# Patient Record
Sex: Male | Born: 1980
Health system: Southern US, Community
[De-identification: ages and names within clinical notes are randomized; demographics above are authoritative.]

## PROBLEM LIST (undated history)

## (undated) DIAGNOSIS — I10 Essential (primary) hypertension: Secondary | ICD-10-CM

## (undated) HISTORY — DX: Essential (primary) hypertension: I10

---

## 2016-03-15 DIAGNOSIS — I10 Essential (primary) hypertension: Secondary | ICD-10-CM | POA: Diagnosis not present

## 2016-03-15 DIAGNOSIS — E7800 Pure hypercholesterolemia, unspecified: Secondary | ICD-10-CM | POA: Diagnosis not present

## 2016-03-15 DIAGNOSIS — Z77011 Contact with and (suspected) exposure to lead: Secondary | ICD-10-CM | POA: Diagnosis not present

## 2016-03-19 DIAGNOSIS — I10 Essential (primary) hypertension: Secondary | ICD-10-CM | POA: Diagnosis not present

## 2016-03-19 DIAGNOSIS — E7800 Pure hypercholesterolemia, unspecified: Secondary | ICD-10-CM | POA: Diagnosis not present

## 2016-03-19 DIAGNOSIS — Z6834 Body mass index (BMI) 34.0-34.9, adult: Secondary | ICD-10-CM | POA: Diagnosis not present

## 2016-03-23 DIAGNOSIS — H43391 Other vitreous opacities, right eye: Secondary | ICD-10-CM | POA: Diagnosis not present

## 2017-03-25 DIAGNOSIS — E7800 Pure hypercholesterolemia, unspecified: Secondary | ICD-10-CM | POA: Diagnosis not present

## 2017-03-25 DIAGNOSIS — Z77011 Contact with and (suspected) exposure to lead: Secondary | ICD-10-CM | POA: Diagnosis not present

## 2017-03-25 DIAGNOSIS — I10 Essential (primary) hypertension: Secondary | ICD-10-CM | POA: Diagnosis not present

## 2017-03-31 DIAGNOSIS — I10 Essential (primary) hypertension: Secondary | ICD-10-CM | POA: Diagnosis not present

## 2017-03-31 DIAGNOSIS — E7800 Pure hypercholesterolemia, unspecified: Secondary | ICD-10-CM | POA: Diagnosis not present

## 2017-03-31 DIAGNOSIS — Z6834 Body mass index (BMI) 34.0-34.9, adult: Secondary | ICD-10-CM | POA: Diagnosis not present

## 2017-09-13 DIAGNOSIS — L600 Ingrowing nail: Secondary | ICD-10-CM | POA: Diagnosis not present

## 2017-09-13 DIAGNOSIS — L03031 Cellulitis of right toe: Secondary | ICD-10-CM | POA: Diagnosis not present

## 2017-09-13 DIAGNOSIS — M79674 Pain in right toe(s): Secondary | ICD-10-CM | POA: Diagnosis not present

## 2017-09-13 DIAGNOSIS — M79671 Pain in right foot: Secondary | ICD-10-CM | POA: Diagnosis not present

## 2017-09-23 DIAGNOSIS — Z6835 Body mass index (BMI) 35.0-35.9, adult: Secondary | ICD-10-CM | POA: Diagnosis not present

## 2017-09-23 DIAGNOSIS — L237 Allergic contact dermatitis due to plants, except food: Secondary | ICD-10-CM | POA: Diagnosis not present

## 2018-02-09 ENCOUNTER — Ambulatory Visit (INDEPENDENT_AMBULATORY_CARE_PROVIDER_SITE_OTHER): Payer: BLUE CROSS/BLUE SHIELD | Admitting: Orthopaedic Surgery

## 2018-02-09 ENCOUNTER — Encounter (INDEPENDENT_AMBULATORY_CARE_PROVIDER_SITE_OTHER): Payer: Self-pay | Admitting: Orthopaedic Surgery

## 2018-02-09 VITALS — BP 126/89 | HR 71 | Ht 70.0 in | Wt 230.0 lb

## 2018-02-09 DIAGNOSIS — R200 Anesthesia of skin: Secondary | ICD-10-CM | POA: Diagnosis not present

## 2018-02-09 NOTE — Progress Notes (Signed)
Office Visit Note   Patient: Calvin Campbell           Date of Birth: 06/02/1980           MRN: [id number] Visit Date: 02/09/2018              Requested by: No referring provider defined for this encounter. PCP: Selinda FlavinHoward, Kevin, MD   Assessment & Plan: Visit Diagnoses:  1. Bilateral hand numbness     Plan: Bilateral wrist splints she can use at night to help with his night symptoms.  He likely has some flexor Tina synovitis from excessive shoveling causing some bilateral carpal tunnel syndrome.  He can return if he has persistent problems.  Pathophysiology discussed.  Follow-Up Instructions: No follow-ups on file.   Orders:  No orders of the defined types were placed in this encounter.  No orders of the defined types were placed in this encounter.     Procedures: No procedures performed   Clinical Data: No additional findings.   Subjective: Chief Complaint  Patient presents with  . Right Hand - Numbness, Pain  . Left Hand - Numbness, Pain    HPI 38 year old male seen with bilateral hand tingling right mostly third finger left first second third finger.  He had been shoveling dirt for home the building and been doing it for multiple hours during the day starting in January 11.  Hand wakes him up at night.  It bothers him when he drives.  Patient works for [employer] but is not noticed problems with work prior to when he started digging with a shovel all day long.  Review of Systems past smoker quit 15 years ago.  Positive for hypertension.  He is here with his wife and daughter.  14 point review of systems otherwise negative is obtains HPI.  He does pick up on the range which should include some light exposure.  Denies numbness or tingling in his feet or legs.  No thyroid conditions.   Objective: Vital Signs: BP 126/89   Pulse 71   Ht 5\' 10"  (1.778 m)   Wt 230 lb (104.3 kg)   BMI 33.00 kg/m   Physical Exam Constitutional:      Appearance: He is well-developed.    HENT:     Head: Normocephalic and atraumatic.  Eyes:     Pupils: Pupils are equal, round, and reactive to light.  Neck:     Thyroid: No thyromegaly.     Trachea: No tracheal deviation.  Cardiovascular:     Rate and Rhythm: Normal rate.  Pulmonary:     Effort: Pulmonary effort is normal.     Breath sounds: No wheezing.  Abdominal:     General: Bowel sounds are normal.     Palpations: Abdomen is soft.  Skin:    General: Skin is warm and dry.     Capillary Refill: Capillary refill takes less than 2 seconds.  Neurological:     Mental Status: He is alert and oriented to person, place, and time.  Psychiatric:        Behavior: Behavior normal.        Thought Content: Thought content normal.        Judgment: Judgment normal.     Ortho Exam some discomfort with carpal compression.  He has calluses over his hands from his recent aggressive shoveling.  No thenar atrophy.  Good cervical range of motion negative cervical compression negative Spurling.  No change with distraction.  Full cervical  range of motion.  No shoulder subluxation ulnar nerve the elbow right and left is normal.  Specialty Comments:  No specialty comments available.  Imaging: No results found.   PMFS History: There are no active problems to display for this patient.  Past Medical History:  Diagnosis Date  . Hypertension     No family history on file.  History reviewed. No pertinent surgical history. Social History   Occupational History  . Not on file  Tobacco Use  . Smoking status: Former Smoker    Years: 15.00  . Smokeless tobacco: Never Used  Substance and Sexual Activity  . Alcohol use: Yes  . Drug use: Not on file  . Sexual activity: Not on file

## 2020-03-14 ENCOUNTER — Emergency Department (HOSPITAL_COMMUNITY): Payer: BC Managed Care – PPO

## 2020-03-14 ENCOUNTER — Other Ambulatory Visit: Payer: Self-pay

## 2020-03-14 ENCOUNTER — Emergency Department (HOSPITAL_COMMUNITY)
Admission: EM | Admit: 2020-03-14 | Discharge: 2020-03-15 | Disposition: A | Discharge status: Home / Self Care | Payer: BC Managed Care – PPO | Attending: Emergency Medicine | Admitting: Emergency Medicine

## 2020-03-14 ENCOUNTER — Encounter (HOSPITAL_COMMUNITY): Payer: Self-pay

## 2020-03-14 DIAGNOSIS — I10 Essential (primary) hypertension: Secondary | ICD-10-CM | POA: Diagnosis not present

## 2020-03-14 DIAGNOSIS — S92354A Nondisplaced fracture of fifth metatarsal bone, right foot, initial encounter for closed fracture: Secondary | ICD-10-CM | POA: Diagnosis not present

## 2020-03-14 DIAGNOSIS — Y92410 Unspecified street and highway as the place of occurrence of the external cause: Secondary | ICD-10-CM | POA: Insufficient documentation

## 2020-03-14 DIAGNOSIS — Z87891 Personal history of nicotine dependence: Secondary | ICD-10-CM | POA: Diagnosis not present

## 2020-03-14 DIAGNOSIS — S92901A Unspecified fracture of right foot, initial encounter for closed fracture: Secondary | ICD-10-CM

## 2020-03-14 DIAGNOSIS — M25562 Pain in left knee: Secondary | ICD-10-CM | POA: Diagnosis not present

## 2020-03-14 DIAGNOSIS — S301XXA Contusion of abdominal wall, initial encounter: Secondary | ICD-10-CM | POA: Diagnosis not present

## 2020-03-14 DIAGNOSIS — S20211A Contusion of right front wall of thorax, initial encounter: Secondary | ICD-10-CM | POA: Insufficient documentation

## 2020-03-14 DIAGNOSIS — S99921A Unspecified injury of right foot, initial encounter: Secondary | ICD-10-CM | POA: Diagnosis present

## 2020-03-14 LAB — CBC WITH DIFFERENTIAL/PLATELET
Abs Immature Granulocytes: 0.08 10*3/uL — ABNORMAL HIGH (ref 0.00–0.07)
Basophils Absolute: 0.1 10*3/uL (ref 0.0–0.1)
Basophils Relative: 1 %
Eosinophils Absolute: 0.1 10*3/uL (ref 0.0–0.5)
Eosinophils Relative: 1 %
HCT: 46.6 % (ref 39.0–52.0)
Hemoglobin: 15.8 g/dL (ref 13.0–17.0)
Immature Granulocytes: 1 %
Lymphocytes Relative: 13 %
Lymphs Abs: 1.9 10*3/uL (ref 0.7–4.0)
MCH: 29.9 pg (ref 26.0–34.0)
MCHC: 33.9 g/dL (ref 30.0–36.0)
MCV: 88.3 fL (ref 80.0–100.0)
Monocytes Absolute: 0.9 10*3/uL (ref 0.1–1.0)
Monocytes Relative: 6 %
Neutro Abs: 11.3 10*3/uL — ABNORMAL HIGH (ref 1.7–7.7)
Neutrophils Relative %: 78 %
Platelets: 280 10*3/uL (ref 150–400)
RBC: 5.28 MIL/uL (ref 4.22–5.81)
RDW: 12.2 % (ref 11.5–15.5)
WBC: 14.3 10*3/uL — ABNORMAL HIGH (ref 4.0–10.5)
nRBC: 0 % (ref 0.0–0.2)

## 2020-03-14 LAB — COMPREHENSIVE METABOLIC PANEL
ALT: 56 U/L — ABNORMAL HIGH (ref 0–44)
AST: 32 U/L (ref 15–41)
Albumin: 4.7 g/dL (ref 3.5–5.0)
Alkaline Phosphatase: 71 U/L (ref 38–126)
Anion gap: 13 (ref 5–15)
BUN: 17 mg/dL (ref 6–20)
CO2: 22 mmol/L (ref 22–32)
Calcium: 9.2 mg/dL (ref 8.9–10.3)
Chloride: 101 mmol/L (ref 98–111)
Creatinine, Ser: 0.98 mg/dL (ref 0.61–1.24)
GFR, Estimated: >60 mL/min (ref >60)
Glucose, Bld: 108 mg/dL — ABNORMAL HIGH (ref 70–99)
Potassium: 3.3 mmol/L — ABNORMAL LOW (ref 3.5–5.1)
Sodium: 136 mmol/L (ref 135–145)
Total Bilirubin: 0.8 mg/dL (ref 0.3–1.2)
Total Protein: 8.1 g/dL (ref 6.5–8.1)

## 2020-03-14 MED ORDER — OXYCODONE-ACETAMINOPHEN 5-325 MG PO TABS
1.0000 | ORAL_TABLET | Freq: Once | ORAL | Status: AC
  Administered 2020-03-14: 1 via ORAL
  Filled 2020-03-14: qty 1

## 2020-03-14 MED ORDER — SENNOSIDES-DOCUSATE SODIUM 8.6-50 MG PO TABS: 1.0000 | ORAL_TABLET | Freq: Every evening | ORAL | 0 refills | Status: AC | PRN

## 2020-03-14 MED ORDER — IOHEXOL 300 MG/ML  SOLN
100.0000 mL | Freq: Once | INTRAMUSCULAR | Status: AC | PRN
  Administered 2020-03-14: 100 mL via INTRAVENOUS

## 2020-03-14 MED ORDER — IBUPROFEN 600 MG PO TABS: 600.0000 mg | ORAL_TABLET | Freq: Four times a day (QID) | ORAL | 0 refills | Status: DC | PRN

## 2020-03-14 MED ORDER — OXYCODONE-ACETAMINOPHEN 5-325 MG PO TABS
1.0000 | ORAL_TABLET | Freq: Four times a day (QID) | ORAL | 0 refills | Status: DC | PRN
Start: 2020-03-14 — End: 2020-03-21
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
## 2020-03-14 NOTE — ED Notes (Signed)
Entered room and introduced self to patient. Pt appears to be resting in bed, respirations are even and unlabored with equal chest rise and fall. Bed is locked in the lowest position, side rails x2, call bell within reach. Pt educated on call light use and hourly rounding, verbalized understanding and in agreement at this time. All questions and concerns voiced addressed. Refreshments offered and provided per patient request.  
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
## 2020-03-14 NOTE — ED Notes (Signed)
Patient transported to X-ray 
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
## 2020-03-17 ENCOUNTER — Encounter: Payer: Self-pay | Admitting: Podiatry

## 2020-03-17 ENCOUNTER — Telehealth: Payer: Self-pay | Admitting: Podiatry

## 2020-03-17 ENCOUNTER — Ambulatory Visit: Payer: BC Managed Care – PPO | Admitting: Podiatry

## 2020-03-17 ENCOUNTER — Other Ambulatory Visit: Payer: Self-pay

## 2020-03-17 DIAGNOSIS — S99191A Other physeal fracture of right metatarsal, initial encounter for closed fracture: Secondary | ICD-10-CM | POA: Diagnosis not present

## 2020-03-17 DIAGNOSIS — S92334A Nondisplaced fracture of third metatarsal bone, right foot, initial encounter for closed fracture: Secondary | ICD-10-CM

## 2020-03-17 DIAGNOSIS — S92344A Nondisplaced fracture of fourth metatarsal bone, right foot, initial encounter for closed fracture: Secondary | ICD-10-CM

## 2020-03-17 NOTE — Telephone Encounter (Signed)
DOS: 03/21/2020  Procedure: Open Treatment Metatarsal 1x ORIF 5th Right (50388)  BCBS Effective 07/19/2018 -  Deductible: $0 Out of Pocket: $7,350 with $12 met and $7,338 remaining. CoInsurance: 100% Copay: $250  Per Gwynn Burly no Prior Authorization or Referrals are Required. Call Ref # [id number].

## 2020-03-17 NOTE — Telephone Encounter (Signed)
Patient requesting a note for DMV to get a handicap decal while he is recovering from surgery. Please advise.

## 2020-03-17 NOTE — Telephone Encounter (Signed)
Yes he should be able to stop by the office and get one, or Burnett Harry can put in his pre op paperwork and I can give to him at surgery. Usually it's a form that he gets from [facility] and there is a spot for [place] to sign/fill
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
## 2020-03-20 DIAGNOSIS — M79676 Pain in unspecified toe(s): Secondary | ICD-10-CM

## 2020-03-21 ENCOUNTER — Other Ambulatory Visit: Payer: Self-pay | Admitting: Podiatry

## 2020-03-21 DIAGNOSIS — S92344A Nondisplaced fracture of fourth metatarsal bone, right foot, initial encounter for closed fracture: Secondary | ICD-10-CM | POA: Diagnosis not present

## 2020-03-21 DIAGNOSIS — S92351A Displaced fracture of fifth metatarsal bone, right foot, initial encounter for closed fracture: Secondary | ICD-10-CM

## 2020-03-21 DIAGNOSIS — S92334A Nondisplaced fracture of third metatarsal bone, right foot, initial encounter for closed fracture: Secondary | ICD-10-CM | POA: Diagnosis not present

## 2020-03-21 MED ORDER — ACETAMINOPHEN 500 MG PO TABS: 1000.0000 mg | ORAL_TABLET | Freq: Four times a day (QID) | ORAL | 0 refills | Status: AC | PRN

## 2020-03-21 MED ORDER — ASPIRIN EC 325 MG PO TBEC: 325.0000 mg | DELAYED_RELEASE_TABLET | Freq: Two times a day (BID) | ORAL | 0 refills | Status: AC

## 2020-03-21 MED ORDER — IBUPROFEN 600 MG PO TABS: 600.0000 mg | ORAL_TABLET | Freq: Four times a day (QID) | ORAL | 0 refills | Status: AC | PRN

## 2020-03-21 MED ORDER — OXYCODONE HCL 5 MG PO TABS: 5.0000 mg | ORAL_TABLET | ORAL | 0 refills | Status: AC | PRN

## 2020-03-21 MED ORDER — GABAPENTIN 300 MG PO CAPS: 300.0000 mg | ORAL_CAPSULE | Freq: Three times a day (TID) | ORAL | 0 refills | Status: AC

## 2020-03-21 NOTE — Progress Notes (Signed)
03/21/20 [facility] ORIF 5th metatarsal fracture closed treatment 3rd/4th metatarsal fractures

## 2020-03-24 ENCOUNTER — Telehealth: Payer: Self-pay | Admitting: Podiatry

## 2020-03-24 NOTE — Telephone Encounter (Signed)
Patient requesting antibiotic (could not recall name, no antibiotic listed in current meds). Please advise.

## 2020-03-24 NOTE — Telephone Encounter (Signed)
No antibiotic required, discussed with him, he's doing well

## 2020-03-27 ENCOUNTER — Other Ambulatory Visit: Payer: Self-pay

## 2020-03-27 ENCOUNTER — Ambulatory Visit (INDEPENDENT_AMBULATORY_CARE_PROVIDER_SITE_OTHER): Payer: BC Managed Care – PPO | Admitting: Podiatry

## 2020-03-27 ENCOUNTER — Ambulatory Visit (INDEPENDENT_AMBULATORY_CARE_PROVIDER_SITE_OTHER): Payer: BC Managed Care – PPO

## 2020-03-27 DIAGNOSIS — S92334D Nondisplaced fracture of third metatarsal bone, right foot, subsequent encounter for fracture with routine healing: Secondary | ICD-10-CM

## 2020-03-27 DIAGNOSIS — S99191A Other physeal fracture of right metatarsal, initial encounter for closed fracture: Secondary | ICD-10-CM

## 2020-03-27 DIAGNOSIS — S99191D Other physeal fracture of right metatarsal, subsequent encounter for fracture with routine healing: Secondary | ICD-10-CM

## 2020-03-27 DIAGNOSIS — S92344D Nondisplaced fracture of fourth metatarsal bone, right foot, subsequent encounter for fracture with routine healing: Secondary | ICD-10-CM

## 2020-03-27 NOTE — Progress Notes (Signed)
  Subjective:  Patient ID: Calvin Campbell, male    DOB: 01-14-1981,  MRN: [id number]  Chief Complaint  Patient presents with  . Routine Post Op     POV #1 DOS 03/21/2020 OPEN REPAIR OF 5TH METATARSAL FX, CLOSED TREATMENT OF 4TH & 3RD METATARSAL FX    DOS: 03/21/2020 Procedure: Fifth metatarsal ORIF, closed fracture treatment third and fourth metatarsal fracture  40 y.o. male returns for post-op check.  Doing well.  Having minimal pain.  Has been NWB.  Review of Systems: Negative except as noted in the HPI. Denies N/V/F/Ch.   Objective:  There were no vitals filed for this visit. There is no height or weight on file to calculate BMI. Constitutional Well developed. Well nourished.  Vascular Foot warm and well perfused. Capillary refill normal to all digits.   Neurologic Normal speech. Oriented to person, place, and time. Epicritic sensation to light touch grossly present bilaterally.  Dermatologic Skin healing well without signs of infection. Skin edges well coapted without signs of infection.  Orthopedic: Tenderness to palpation noted about the surgical site.   Radiographs: Status post ORIF of the fifth metatarsal fracture, third and fourth metatarsal fractures are still well aligned Assessment:   1. Closed fracture of base of fifth metatarsal bone of right foot at metaphyseal-diaphyseal junction with routine healing, subsequent encounter   2. Nondisplaced fracture of third metatarsal bone, right foot, subsequent encounter for fracture with routine healing   3. Closed nondisplaced fracture of fourth metatarsal bone of right foot with routine healing, subsequent encounter    Plan:  Patient was evaluated and treated and all questions answered.  S/p foot surgery right -Progressing as expected post-operatively. -XR: As above -WB Status: NWB in CAM boot -Sutures: We will remove next week. -Medications: No refills required -Foot redressed. -May return to work on light duty with  maintenance of NWB -Plan to begin partial weightbearing in boot next week  No follow-ups on file.

## 2020-04-03 ENCOUNTER — Other Ambulatory Visit: Payer: Self-pay

## 2020-04-03 ENCOUNTER — Ambulatory Visit (INDEPENDENT_AMBULATORY_CARE_PROVIDER_SITE_OTHER): Payer: BC Managed Care – PPO | Admitting: Podiatry

## 2020-04-03 DIAGNOSIS — S92344D Nondisplaced fracture of fourth metatarsal bone, right foot, subsequent encounter for fracture with routine healing: Secondary | ICD-10-CM

## 2020-04-03 DIAGNOSIS — S99191D Other physeal fracture of right metatarsal, subsequent encounter for fracture with routine healing: Secondary | ICD-10-CM

## 2020-04-03 DIAGNOSIS — S92334D Nondisplaced fracture of third metatarsal bone, right foot, subsequent encounter for fracture with routine healing: Secondary | ICD-10-CM

## 2020-04-03 NOTE — Patient Instructions (Signed)
Gradually increase your weight bearing over the next few weeks  You may begin bathing regularly  Wear the compression sleeve  Bring your the boots and a supportive sneaker at your next visit

## 2020-04-07 NOTE — Progress Notes (Signed)
  Subjective:  Patient ID: Calvin Campbell, male    DOB: [birth year],  MRN: [id number]  Chief Complaint  Patient presents with  . Routine Post Op    PT stated that he is doing well he has no concerns and denies any pain     DOS: 03/21/2020 Procedure: Fifth metatarsal ORIF, closed fracture treatment third and fourth metatarsal fracture  40 y.o. male returns for post-op check.  Doing well.  Having minimal pain.  Has been NWB.  Review of Systems: Negative except as noted in the HPI. Denies N/V/F/Ch.   Objective:  There were no vitals filed for this visit. There is no height or weight on file to calculate BMI. Constitutional Well developed. Well nourished.  Vascular Foot warm and well perfused. Capillary refill normal to all digits.   Neurologic Normal speech. Oriented to person, place, and time. Epicritic sensation to light touch grossly present bilaterally.  Dermatologic Skin healing well without signs of infection. Skin edges well coapted without signs of infection.  Orthopedic: Tenderness to palpation noted about the surgical site.   Radiographs: Status post ORIF of the fifth metatarsal fracture, third and fourth metatarsal fractures are still well aligned Assessment:   1. Closed fracture of base of fifth metatarsal bone of right foot at metaphyseal-diaphyseal junction with routine healing, subsequent encounter   2. Nondisplaced fracture of third metatarsal bone, right foot, subsequent encounter for fracture with routine healing   3. Closed nondisplaced fracture of fourth metatarsal bone of right foot with routine healing, subsequent encounter    Plan:  Patient was evaluated and treated and all questions answered.  S/p foot surgery right -Progressing as expected post-operatively. -XR: As above -WB Status: Begin partial weightbearing to heel and increase over next 3 weeks -Sutures: Removed today -Medications: No refills required -May begin bathing k  Return in 18 days (on  04/21/2020) for 5th met fracture POV take new x-rays, transition to regular boot.

## 2020-04-17 ENCOUNTER — Encounter: Payer: BC Managed Care – PPO | Admitting: Podiatry

## 2020-04-21 ENCOUNTER — Other Ambulatory Visit: Payer: Self-pay

## 2020-04-21 ENCOUNTER — Encounter: Payer: Self-pay | Admitting: Podiatry

## 2020-04-21 ENCOUNTER — Ambulatory Visit (INDEPENDENT_AMBULATORY_CARE_PROVIDER_SITE_OTHER): Payer: BC Managed Care – PPO | Admitting: Podiatry

## 2020-04-21 ENCOUNTER — Ambulatory Visit (INDEPENDENT_AMBULATORY_CARE_PROVIDER_SITE_OTHER): Payer: BC Managed Care – PPO

## 2020-04-21 DIAGNOSIS — S99191D Other physeal fracture of right metatarsal, subsequent encounter for fracture with routine healing: Secondary | ICD-10-CM | POA: Diagnosis not present

## 2020-04-21 DIAGNOSIS — S92344D Nondisplaced fracture of fourth metatarsal bone, right foot, subsequent encounter for fracture with routine healing: Secondary | ICD-10-CM

## 2020-04-21 DIAGNOSIS — S92334D Nondisplaced fracture of third metatarsal bone, right foot, subsequent encounter for fracture with routine healing: Secondary | ICD-10-CM

## 2020-04-21 NOTE — Progress Notes (Signed)
  Subjective:  Patient ID: Calvin Campbell, male    DOB: [birth year],  MRN: [id number]  Chief Complaint  Patient presents with  . Routine Post Op    )POV #4 DOS 03/21/2020 OPEN REPAIR OF 5TH METATARSAL FX, CLOSED TREATMENT OF 4TH & 3RD METATARSAL FX    DOS: 03/21/2020 Procedure: Fifth metatarsal ORIF, closed fracture treatment third and fourth metatarsal fracture  40 y.o. male returns for post-op check.  Doing well.  Having minimal pain.  Feels somewhat stiff and sore after work.  He is in his work boots.  He has been increasing his activity gradually  Review of Systems: Negative except as noted in the HPI. Denies N/V/F/Ch.   Objective:  There were no vitals filed for this visit. There is no height or weight on file to calculate BMI. Constitutional Well developed. Well nourished.  Vascular Foot warm and well perfused. Capillary refill normal to all digits.   Neurologic Normal speech. Oriented to person, place, and time. Epicritic sensation to light touch grossly present bilaterally.  Dermatologic  well-healed surgical incision  Orthopedic:  Today with no tenderness to palpation noted about the surgical site.  Minimal edema   Radiographs: Bony callus forming around the third and fourth metatarsal fractures, consolidation forming to the fracture line of the fifth metatarsal with screw intact and unchanged hardware alignment and position Assessment:   1. Closed fracture of base of fifth metatarsal bone of right foot at metaphyseal-diaphyseal junction with routine healing, subsequent encounter   2. Nondisplaced fracture of third metatarsal bone, right foot, subsequent encounter for fracture with routine healing   3. Closed nondisplaced fracture of fourth metatarsal bone of right foot with routine healing, subsequent encounter    Plan:  Patient was evaluated and treated and all questions answered.  S/p foot surgery right -Progressing as expected post-operatively. -XR: As above -WB  Status: Continue WBAT in his work boots.  He may gradually increase his work duties over the next 4 weeks until he is at 100% his normal duties in 4 weeks.  We discussed that some pain and swelling as expected but should not be hurting much more than the time it takes him to go home and should not have any the next morning.  Ice and elevate as needed   Return in about 1 month (around 05/21/2020) for fracture follow-up with new x-rays .

## 2020-04-29 ENCOUNTER — Telehealth: Payer: Self-pay | Admitting: Podiatry

## 2020-04-29 NOTE — Telephone Encounter (Signed)
Called pt lvm to reschedule 5/5 appt  

## 2020-05-22 ENCOUNTER — Encounter: Payer: BC Managed Care – PPO | Admitting: Podiatry

## 2020-05-26 ENCOUNTER — Encounter: Payer: Self-pay | Admitting: Podiatry

## 2020-05-26 ENCOUNTER — Ambulatory Visit (INDEPENDENT_AMBULATORY_CARE_PROVIDER_SITE_OTHER): Payer: BC Managed Care – PPO | Admitting: Podiatry

## 2020-05-26 ENCOUNTER — Ambulatory Visit (INDEPENDENT_AMBULATORY_CARE_PROVIDER_SITE_OTHER): Payer: BC Managed Care – PPO

## 2020-05-26 ENCOUNTER — Other Ambulatory Visit: Payer: Self-pay

## 2020-05-26 DIAGNOSIS — S99191D Other physeal fracture of right metatarsal, subsequent encounter for fracture with routine healing: Secondary | ICD-10-CM | POA: Diagnosis not present

## 2020-05-26 DIAGNOSIS — S92344D Nondisplaced fracture of fourth metatarsal bone, right foot, subsequent encounter for fracture with routine healing: Secondary | ICD-10-CM

## 2020-05-26 DIAGNOSIS — S92334D Nondisplaced fracture of third metatarsal bone, right foot, subsequent encounter for fracture with routine healing: Secondary | ICD-10-CM

## 2020-05-26 NOTE — Progress Notes (Signed)
  Subjective:  Patient ID: Calvin Campbell, male    DOB: Nov 13, 1980,  MRN: [id number]  Chief Complaint  Patient presents with  . Routine Post Op    POV #5 DOS 03/21/2020 OPEN REPAIR OF 5TH METATARSAL FX, CLOSED TREATMENT OF 4TH & 3RD METATARSAL FX. Doing well, no concerns today.     DOS: 03/21/2020 Procedure: Fifth metatarsal ORIF, closed fracture treatment third and fourth metatarsal fracture  40 y.o. male returns for post-op check.  Doing well.  Little to no pain.  He still feels unsure about being able to go up ladders  Review of Systems: Negative except as noted in the HPI. Denies N/V/F/Ch.   Objective:  There were no vitals filed for this visit. There is no height or weight on file to calculate BMI. Constitutional Well developed. Well nourished.  Vascular Foot warm and well perfused. Capillary refill normal to all digits.   Neurologic Normal speech. Oriented to person, place, and time. Epicritic sensation to light touch grossly present bilaterally.  Dermatologic  well-healed surgical incision  Orthopedic:  Today with no tenderness to palpation noted about the surgical site.  Minimal edema   Radiographs: Fractures are nearly fully healed of third fourth and fifth metatarsals with an unchanged hardware alignment and position Assessment:   1. Closed fracture of base of fifth metatarsal bone of right foot at metaphyseal-diaphyseal junction with routine healing, subsequent encounter   2. Nondisplaced fracture of third metatarsal bone, right foot, subsequent encounter for fracture with routine healing   3. Closed nondisplaced fracture of fourth metatarsal bone of right foot with routine healing, subsequent encounter    Plan:  Patient was evaluated and treated and all questions answered.  S/p foot surgery right -He is doing quite well.  I am now releasing him back to his full work duties.  Advised to avoid impactful exercise until 6 months after surgery.  I would like to see him  back in 4 months for his 53-month postop visit and final x-rays and plan to discharge after this if doing well   Return in about 4 months (around 09/26/2020) for 6 month post op visit, take new xrays.

## 2020-11-27 ENCOUNTER — Other Ambulatory Visit: Payer: Self-pay

## 2020-11-27 ENCOUNTER — Ambulatory Visit (INDEPENDENT_AMBULATORY_CARE_PROVIDER_SITE_OTHER): Payer: BC Managed Care – PPO

## 2020-11-27 ENCOUNTER — Other Ambulatory Visit: Payer: Self-pay | Admitting: Podiatry

## 2020-11-27 ENCOUNTER — Ambulatory Visit: Payer: BC Managed Care – PPO | Admitting: Podiatry

## 2020-11-27 DIAGNOSIS — S99191D Other physeal fracture of right metatarsal, subsequent encounter for fracture with routine healing: Secondary | ICD-10-CM

## 2020-12-01 ENCOUNTER — Encounter: Payer: Self-pay | Admitting: Podiatry

## 2020-12-01 NOTE — Progress Notes (Signed)
  Subjective:  Patient ID: Calvin Campbell, male    DOB: [birth year],  MRN: [id number]  Chief Complaint  Patient presents with   Fracture      6 month post op visit, take new xrays    40 y.o. male presents with the above complaint. History confirmed with patient.  Doing well not having much pain at all  Objective:  Physical Exam: warm, good capillary refill, no trophic changes or ulcerative lesions, normal DP and PT pulses, and normal sensory exam. Left Foot: normal exam, no swelling, tenderness, instability; ligaments intact, full range of motion of all ankle/foot joints Right Foot: normal exam, no swelling, tenderness, instability; ligaments intact, full range of motion of all ankle/foot joints  No images are attached to the encounter.  Radiographs: Multiple views x-ray of the right foot: Good consolidation of the fracture site from prior films Assessment:   1. Closed fracture of base of fifth metatarsal bone of right foot at metaphyseal-diaphyseal junction with routine healing, subsequent encounter      Plan:  Patient was evaluated and treated and all questions answered.  Overall doing well.  Hopefully low risk of refracture.  He will return to see me as needed for this or other issues  Return if symptoms worsen or fail to improve.
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
